# Patient Record
Sex: Male | Born: 1965 | Race: Black or African American | Hispanic: No | Marital: Married | Smoking: Former smoker
Health system: Southern US, Community
[De-identification: ages and names within clinical notes are randomized; demographics above are authoritative.]

## PROBLEM LIST (undated history)

## (undated) DIAGNOSIS — I10 Essential (primary) hypertension: Secondary | ICD-10-CM

## (undated) HISTORY — PX: CORONARY ARTERY BYPASS GRAFT: SHX141

---

## 1995-11-01 HISTORY — PX: COLON SURGERY: SHX602

## 1998-02-09 ENCOUNTER — Emergency Department (HOSPITAL_COMMUNITY): Admission: EM | Admit: 1998-02-09 | Discharge: 1998-02-09 | Payer: Self-pay | Admitting: Emergency Medicine

## 1998-04-06 ENCOUNTER — Emergency Department (HOSPITAL_COMMUNITY): Admission: EM | Admit: 1998-04-06 | Discharge: 1998-04-06 | Payer: Self-pay | Admitting: Emergency Medicine

## 2001-05-27 ENCOUNTER — Emergency Department (HOSPITAL_COMMUNITY): Admission: EM | Admit: 2001-05-27 | Discharge: 2001-05-27 | Payer: Self-pay | Admitting: Emergency Medicine

## 2004-01-05 ENCOUNTER — Emergency Department (HOSPITAL_COMMUNITY): Admission: EM | Admit: 2004-01-05 | Discharge: 2004-01-05 | Payer: Self-pay | Admitting: Emergency Medicine

## 2007-12-04 ENCOUNTER — Ambulatory Visit: Payer: Self-pay | Admitting: Internal Medicine

## 2007-12-04 DIAGNOSIS — K056 Periodontal disease, unspecified: Secondary | ICD-10-CM | POA: Insufficient documentation

## 2007-12-04 DIAGNOSIS — K069 Disorder of gingiva and edentulous alveolar ridge, unspecified: Secondary | ICD-10-CM

## 2007-12-04 DIAGNOSIS — H00019 Hordeolum externum unspecified eye, unspecified eyelid: Secondary | ICD-10-CM

## 2007-12-04 DIAGNOSIS — R079 Chest pain, unspecified: Secondary | ICD-10-CM

## 2007-12-07 ENCOUNTER — Encounter (INDEPENDENT_AMBULATORY_CARE_PROVIDER_SITE_OTHER): Payer: Self-pay | Admitting: Internal Medicine

## 2008-01-14 ENCOUNTER — Ambulatory Visit: Payer: Self-pay | Admitting: Internal Medicine

## 2008-01-16 ENCOUNTER — Ambulatory Visit: Payer: Self-pay | Admitting: Internal Medicine

## 2010-07-07 ENCOUNTER — Emergency Department (HOSPITAL_COMMUNITY): Admission: EM | Admit: 2010-07-07 | Discharge: 2010-07-07 | Payer: Self-pay | Admitting: Emergency Medicine

## 2010-11-28 LAB — CONVERTED CEMR LAB
ALT: 30 units/L (ref 0–53)
Alkaline Phosphatase: 68 units/L (ref 39–117)
Basophils Absolute: 0 10*3/uL (ref 0.0–0.1)
CO2: 22 meq/L (ref 19–32)
Cholesterol: 266 mg/dL — ABNORMAL HIGH (ref 0–200)
Creatinine, Ser: 1.15 mg/dL (ref 0.40–1.50)
Eosinophils Absolute: 0.3 10*3/uL (ref 0.0–0.7)
Eosinophils Relative: 4 % (ref 0–5)
Glucose, Urine, Semiquant: NEGATIVE
HCT: 45.3 % (ref 39.0–52.0)
Hemoglobin: 15.9 g/dL (ref 13.0–17.0)
LDL Cholesterol: 162 mg/dL — ABNORMAL HIGH (ref 0–99)
Lymphocytes Relative: 43 % (ref 12–46)
MCHC: 35.1 g/dL (ref 30.0–36.0)
MCV: 89.3 fL (ref 78.0–100.0)
Monocytes Absolute: 0.6 10*3/uL (ref 0.1–1.0)
Nitrite: NEGATIVE
Platelets: 199 10*3/uL (ref 150–400)
RDW: 13.3 % (ref 11.5–15.5)
Sodium: 143 meq/L (ref 135–145)
Total Bilirubin: 0.9 mg/dL (ref 0.3–1.2)
Total CHOL/HDL Ratio: 5.8
Total Protein: 7.9 g/dL (ref 6.0–8.3)
Triglycerides: 291 mg/dL — ABNORMAL HIGH (ref <150)
Urobilinogen, UA: 0.2
VLDL: 58 mg/dL — ABNORMAL HIGH (ref 0–40)
WBC Urine, dipstick: NEGATIVE

## 2017-03-02 ENCOUNTER — Encounter (HOSPITAL_COMMUNITY): Payer: Self-pay | Admitting: Emergency Medicine

## 2017-03-02 ENCOUNTER — Emergency Department (HOSPITAL_COMMUNITY)
Admission: EM | Admit: 2017-03-02 | Discharge: 2017-03-02 | Disposition: A | Discharge status: Home / Self Care | Payer: BLUE CROSS/BLUE SHIELD | Attending: Emergency Medicine | Admitting: Emergency Medicine

## 2017-03-02 DIAGNOSIS — H9201 Otalgia, right ear: Secondary | ICD-10-CM | POA: Diagnosis present

## 2017-03-02 DIAGNOSIS — H6691 Otitis media, unspecified, right ear: Secondary | ICD-10-CM | POA: Insufficient documentation

## 2017-03-02 DIAGNOSIS — H6690 Otitis media, unspecified, unspecified ear: Secondary | ICD-10-CM

## 2017-03-02 MED ORDER — FLUTICASONE PROPIONATE 50 MCG/ACT NA SUSP: 1.0000 | Freq: Every day | NASAL | 2 refills | Status: DC

## 2017-03-02 MED ORDER — AMOXICILLIN 500 MG PO CAPS: 500.0000 mg | ORAL_CAPSULE | Freq: Two times a day (BID) | ORAL | 0 refills | Status: DC

## 2017-03-02 NOTE — ED Provider Notes (Signed)
[facility]-EMER[facility]ENCY DEPT Provider Note   CSN: [id number] Arrival date & time: 03/02/17  0702     History   Chief Complaint Chief Complaint  Patient presents with  . Otalgia  . Hearing Problem    HPI Duane White is a 51 y.o. male.  HPI   51 year old male presents today with complaints of ear pain.  Patient notes over the last several weeks she has had coming and going ear pain.  Patient notes that this is localized to the right ear this morning.  He notes that his hearing has seemed muffled with the associated ear discomfort.  He also notes rhinorrhea, congestion.  Denies any productive cough, fever chills nausea vomiting.  No medications prior to arrival.  No history of allergies.    History reviewed. No pertinent past medical history.  Patient Active Problem List   Diagnosis Date Noted  . 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 CHEST PAIN 12/04/2007    History reviewed. No pertinent surgical history.     Home Medications    Prior to Admission medications   Medication Sig Start Date End Date Taking? Authorizing Provider  amoxicillin (AMOXIL) 500 MG capsule Take 1 capsule (500 mg total) by mouth 2 (two) times daily. 03/02/17   Eyvonne MechanicJeffrey Byran Bilotti, PA-C  fluticasone (FLONASE) 50 MCG/ACT nasal spray Place 1 spray into both nostrils daily. 03/02/17   Eyvonne MechanicJeffrey Aunna Snooks, PA-C    Family History No family history on file.  Social History Social History  Substance Use Topics  . Smoking status: Not on file  . Smokeless tobacco: Not on file  . Alcohol use Not on file     Allergies   Patient has no known allergies.   Review of Systems Review of Systems  All other systems reviewed and are negative.  Physical Exam Updated Vital Signs BP (!) 139/94   Pulse 66   Temp 98.2 F (36.8 C) (Oral)   Resp 18   SpO2 99%   Physical Exam  Constitutional: He is oriented to person, place, and time. He appears well-developed and well-nourished.  HENT:  Head:  Normocephalic and atraumatic.  Left sided TM redness  Eyes: Conjunctivae are normal. Pupils are equal, round, and reactive to light. Right eye exhibits no discharge. Left eye exhibits no discharge. No scleral icterus.  Neck: Normal range of motion. No JVD present. No tracheal deviation present.  Pulmonary/Chest: Effort normal. No stridor.  Neurological: He is alert and oriented to person, place, and time. Coordination normal.  Psychiatric: He has a normal mood and affect. His behavior is normal. Judgment and thought content normal.  Nursing note and vitals reviewed.   ED Treatments / Results  Labs (all labs ordered are listed, but only abnormal results are displayed) Labs Reviewed - No data to display  EKG  EKG Interpretation None       Radiology No results found.  Procedures Procedures (including critical care time)  Medications Ordered in ED Medications - No data to display   Initial Impression / Assessment and Plan / ED Course  I have reviewed the triage vital signs and the nursing notes.  Pertinent labs & imaging results that were available during my care of the patient were reviewed by me and considered in my medical decision making (see chart for details).     Final Clinical Impressions(s) / ED Diagnoses   Final diagnoses:  Acute otitis media, unspecified otitis media type    Discharge Meds: Amoxicillin  Assessment/Plan: 51 year old male presents today  with otitis media.  He is well-appearing in no acute distress.  He will be started on amoxicillin, encouraged use Tylenol ibuprofen.  Patient is given strict return precautions.  He verbalized understanding and agreement to today's plan had no further questions or concerns at the time discharge   New Prescriptions Discharge Medication List as of 03/02/2017  8:25 AM    START taking these medications   Details  amoxicillin (AMOXIL) 500 MG capsule Take 1 capsule (500 mg total) by mouth 2 (two) times daily.,  Starting Thu 03/02/2017, Print    fluticasone (FLONASE) 50 MCG/ACT nasal spray Place 1 spray into both nostrils daily., Starting Thu 03/02/2017, Print         Eyvonne Mechanic, PA-C 03/02/17 1215    Raeford Razor, MD 03/06/17 1217

## 2017-03-02 NOTE — Discharge Instructions (Signed)
Please read attached information. If you experience any new or worsening signs or symptoms please return to the emergency room for evaluation. Please follow-up with your primary care provider or specialist as discussed. Please use medication prescribed only as directed and discontinue taking if you have any concerning signs or symptoms.   °

## 2017-03-02 NOTE — ED Triage Notes (Signed)
Pt reports right ear pain and feeling like his hearing is coming and going in both ears x 2 weeks. Also reports recent cold symptoms as well.

## 2019-07-07 ENCOUNTER — Emergency Department
Admission: EM | Admit: 2019-07-07 | Discharge: 2019-07-07 | Disposition: A | Discharge status: Home / Self Care | Payer: BLUE CROSS/BLUE SHIELD | Attending: Emergency Medicine | Admitting: Emergency Medicine

## 2019-07-07 ENCOUNTER — Other Ambulatory Visit: Payer: Self-pay

## 2019-07-07 DIAGNOSIS — Y939 Activity, unspecified: Secondary | ICD-10-CM | POA: Insufficient documentation

## 2019-07-07 DIAGNOSIS — X58XXXA Exposure to other specified factors, initial encounter: Secondary | ICD-10-CM | POA: Insufficient documentation

## 2019-07-07 DIAGNOSIS — S0501XA Injury of conjunctiva and corneal abrasion without foreign body, right eye, initial encounter: Secondary | ICD-10-CM | POA: Insufficient documentation

## 2019-07-07 DIAGNOSIS — Y990 Civilian activity done for income or pay: Secondary | ICD-10-CM | POA: Insufficient documentation

## 2019-07-07 DIAGNOSIS — F17200 Nicotine dependence, unspecified, uncomplicated: Secondary | ICD-10-CM | POA: Insufficient documentation

## 2019-07-07 DIAGNOSIS — Y929 Unspecified place or not applicable: Secondary | ICD-10-CM | POA: Insufficient documentation

## 2019-07-07 MED ORDER — ERYTHROMYCIN 5 MG/GM OP OINT: 1.0000 "application " | TOPICAL_OINTMENT | Freq: Four times a day (QID) | OPHTHALMIC | 0 refills | Status: AC

## 2019-07-07 MED ORDER — ERYTHROMYCIN 5 MG/GM OP OINT
TOPICAL_OINTMENT | Freq: Once | OPHTHALMIC | Status: AC
  Administered 2019-07-07: 1 via OPHTHALMIC
  Filled 2019-07-07: qty 1

## 2019-07-07 MED ORDER — KETOROLAC TROMETHAMINE 0.5 % OP SOLN: 1.0000 [drp] | Freq: Four times a day (QID) | OPHTHALMIC | 0 refills | Status: DC

## 2019-07-07 MED ORDER — FLUORESCEIN SODIUM 1 MG OP STRP
1.0000 | ORAL_STRIP | Freq: Once | OPHTHALMIC | Status: AC
  Administered 2019-07-07: 1 via OPHTHALMIC
  Filled 2019-07-07: qty 1

## 2019-07-07 MED ORDER — TETRACAINE HCL 0.5 % OP SOLN
2.0000 [drp] | Freq: Once | OPHTHALMIC | Status: AC
  Administered 2019-07-07: 08:00:00 2 [drp] via OPHTHALMIC
  Filled 2019-07-07: qty 4

## 2019-07-07 NOTE — ED Provider Notes (Signed)
[facility] Emergency Department Provider Note ____________________________________________  Time seen: Approximately 8:28 AM  I have reviewed the triage vital signs and the nursing notes.   HISTORY  Chief Complaint Foreign Body in Eye   HPI Duane White is a 53 y.o. male who presents to the emergency department for evaluation of right eye pain. He states that something got in his eye a few days ago when he was at work. Then last night, something flew into his eye. Pain and irritation since. No alleviating measures prior to arrival.   History reviewed. No pertinent past medical history.  Patient Active Problem List   Diagnosis Date Noted  . 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 CHEST PAIN 12/04/2007    History reviewed. No pertinent surgical history.  Prior to Admission medications   Medication Sig Start Date End Date Taking? Authorizing Provider  erythromycin ophthalmic ointment Place 1 application into the right eye 4 (four) times daily for 5 days. 07/07/19 07/12/19  Nashira Mcglynn, Kasandra Knudsenari B, FNP  ketorolac (ACULAR) 0.5 % ophthalmic solution Place 1 drop into the right eye 4 (four) times daily. 07/07/19   Chinita Pesterriplett, Shannell Mikkelsen B, FNP    Allergies Patient has no known allergies.  No family history on file.  Social History Social History   Tobacco Use  . Smoking status: Current Every Day Smoker  . Smokeless tobacco: Never Used  Substance Use Topics  . Alcohol use: Yes  . Drug use: Not Currently    Review of Systems   Constitutional: No fever/chills Eyes: Negative for visual changes. positive for pain. negative for drainage. Musculoskeletal: Negative for pain. Skin: Negative for rash. Neurological: Negative for headaches, focal weakness or numbness. Allergic: Negative for seasonal allergies. ____________________________________________  PHYSICAL EXAM:  VITAL SIGNS: ED Triage Vitals  Enc Vitals Group     BP 07/07/19 0744 (!) 141/84      Pulse Rate 07/07/19 0744 62     Resp 07/07/19 0744 18     Temp 07/07/19 0744 98.8 F (37.1 C)     Temp Source 07/07/19 0744 Oral     SpO2 07/07/19 0744 98 %     Weight 07/07/19 0742 220 lb (99.8 kg)     Height 07/07/19 0742 5\' 8"  (1.727 m)     Head Circumference --      Peak Flow --      Pain Score 07/07/19 0741 3     Pain Loc --      Pain Edu? --      Excl. in GC? --     Constitutional: Alert and oriented. Well appearing and in no acute distress. Eyes: Visual acuity--see nursing documentation; no globe trauma; Eyelids normal to inspection; Sclera appears anicteric.  Eyelid was inverted. Conjunctiva appears erythematous; Cornea with (2) 1-602mm abrasions at 9 o'clock and 3 o'clock. Head: Atraumatic. Nose: No congestion/rhinnorhea. Mouth/Throat: Mucous membranes are moist.  Oropharynx non-erythematous. Respiratory: Respirations even and unlabored. Breath sounds clear to auscultation. Musculoskeletal:Normal ROM x 4 extremities. Neurologic:  Normal speech and language. No gross focal neurologic deficits are appreciated. Speech is normal. No gait instability. Skin:  Skin is warm, dry and intact. No rash noted. Psychiatric: Mood and affect are normal. Speech and behavior are normal.  ____________________________________________   LABS (all labs ordered are listed, but only abnormal results are displayed)  Labs Reviewed - No data to display ____________________________________________  EKG  Not indicated ____________________________________________  RADIOLOGY  Not indicated ____________________________________________   PROCEDURES  Procedure(s) performed: None  ____________________________________________   INITIAL IMPRESSION / ASSESSMENT AND PLAN / ED COURSE  53 year old male presenting for eye pain and irritation. Corneal abrasions noted on fluorescein exam. He will be treated with erythromycin ointment and Acular. He was advised to follow up with ophthalmology if  not improving over the next 2-3 days. He was advised to return to the ER if unable to schedule and appointment.  Pertinent labs & imaging results that were available during my care of the patient were reviewed by me and considered in my medical decision making (see chart for details). ____________________________________________   FINAL CLINICAL IMPRESSION(S) / ED DIAGNOSES  Final diagnoses:  Abrasion of right cornea, initial encounter    Note:  This document was prepared using Dragon voice recognition software and may include unintentional dictation errors.    Victorino Dike, FNP 07/07/19 6010    Delman Kitten, MD 07/07/19 [phone]

## 2019-07-07 NOTE — ED Notes (Signed)
See triage note  States he developed right eye pain couple of days ago  But felt something in eye last pm   Eye isred and irritated

## 2019-07-07 NOTE — ED Triage Notes (Signed)
Pt states something got is his right eye yesterday and has been irritated ever since. Pt is in NAD> eye is red and appears irritated

## 2020-03-03 ENCOUNTER — Other Ambulatory Visit: Payer: Self-pay

## 2020-03-03 ENCOUNTER — Ambulatory Visit (HOSPITAL_COMMUNITY)
Admission: EM | Admit: 2020-03-03 | Discharge: 2020-03-03 | Disposition: A | Discharge status: Home / Self Care | Payer: Self-pay

## 2020-03-03 ENCOUNTER — Emergency Department (HOSPITAL_COMMUNITY)
Admission: EM | Admit: 2020-03-03 | Discharge: 2020-03-03 | Discharge status: Against Medical Advice | Payer: Self-pay | Attending: Emergency Medicine | Admitting: Emergency Medicine

## 2020-03-03 ENCOUNTER — Emergency Department (HOSPITAL_COMMUNITY): Payer: Self-pay

## 2020-03-03 ENCOUNTER — Encounter (HOSPITAL_COMMUNITY): Payer: Self-pay | Admitting: Emergency Medicine

## 2020-03-03 DIAGNOSIS — F17210 Nicotine dependence, cigarettes, uncomplicated: Secondary | ICD-10-CM | POA: Insufficient documentation

## 2020-03-03 DIAGNOSIS — R1013 Epigastric pain: Secondary | ICD-10-CM

## 2020-03-03 LAB — CBC
HCT: 45 % (ref 39.0–52.0)
Hemoglobin: 15.2 g/dL (ref 13.0–17.0)
MCH: 32.4 pg (ref 26.0–34.0)
MCHC: 33.8 g/dL (ref 30.0–36.0)
MCV: 95.9 fL (ref 80.0–100.0)
Platelets: 169 10*3/uL (ref 150–400)
RBC: 4.69 MIL/uL (ref 4.22–5.81)
RDW: 12.7 % (ref 11.5–15.5)
WBC: 6.2 10*3/uL (ref 4.0–10.5)
nRBC: 0 % (ref 0.0–0.2)

## 2020-03-03 LAB — COMPREHENSIVE METABOLIC PANEL
ALT: 35 U/L (ref 0–44)
AST: 38 U/L (ref 15–41)
Albumin: 4.1 g/dL (ref 3.5–5.0)
Alkaline Phosphatase: 69 U/L (ref 38–126)
Anion gap: 10 (ref 5–15)
BUN: 9 mg/dL (ref 6–20)
CO2: 23 mmol/L (ref 22–32)
Calcium: 9.4 mg/dL (ref 8.9–10.3)
Chloride: 106 mmol/L (ref 98–111)
Creatinine, Ser: 1.12 mg/dL (ref 0.61–1.24)
GFR calc Af Amer: >60 mL/min (ref >60)
GFR calc non Af Amer: >60 mL/min (ref >60)
Glucose, Bld: 139 mg/dL — ABNORMAL HIGH (ref 70–99)
Potassium: 3.3 mmol/L — ABNORMAL LOW (ref 3.5–5.1)
Sodium: 139 mmol/L (ref 135–145)
Total Bilirubin: 1.8 mg/dL — ABNORMAL HIGH (ref 0.3–1.2)
Total Protein: 7.1 g/dL (ref 6.5–8.1)

## 2020-03-03 LAB — LIPASE, BLOOD: Lipase: 20 U/L (ref 11–51)

## 2020-03-03 LAB — URINALYSIS, ROUTINE W REFLEX MICROSCOPIC
Bilirubin Urine: NEGATIVE
Glucose, UA: NEGATIVE mg/dL
Ketones, ur: NEGATIVE mg/dL
Leukocytes,Ua: NEGATIVE
Nitrite: NEGATIVE
Protein, ur: 30 mg/dL — AB
Specific Gravity, Urine: 1.027 (ref 1.005–1.030)
pH: 5 (ref 5.0–8.0)

## 2020-03-03 MED ORDER — LIDOCAINE VISCOUS HCL 2 % MT SOLN
15.0000 mL | Freq: Once | OROMUCOSAL | Status: AC
  Administered 2020-03-03: 16:00:00 15 mL via ORAL
  Filled 2020-03-03: qty 15

## 2020-03-03 MED ORDER — ALUM & MAG HYDROXIDE-SIMETH 200-200-20 MG/5ML PO SUSP
30.0000 mL | Freq: Once | ORAL | Status: AC
  Administered 2020-03-03: 16:00:00 30 mL via ORAL
  Filled 2020-03-03 (×2): qty 30

## 2020-03-03 MED ORDER — SODIUM CHLORIDE 0.9% FLUSH: 3.0000 mL | Freq: Once | INTRAVENOUS | Status: DC

## 2020-03-03 MED ORDER — PANTOPRAZOLE SODIUM 20 MG PO TBEC: 20.0000 mg | DELAYED_RELEASE_TABLET | Freq: Every day | ORAL | 1 refills | Status: AC

## 2020-03-03 NOTE — Discharge Instructions (Signed)
Prior discussion, I am prescribing you Protonix.  This is an antacid medication that you can take once daily in the morning for relief of your upper abdominal pain.  I would highly recommend that you stop drinking alcohol.  It is likely worsening your symptoms.  Because you decided to leave AMA today, I would [facility] and your symptoms.  Please do not hesitate to return to the emergency department any new or worsening symptoms.

## 2020-03-03 NOTE — ED Provider Notes (Signed)
Duane White   CSN: [id number] Arrival date & time: 03/03/20  1318     History Chief Complaint  Patient presents with  . Abdominal Pain    Duane White is a 54 y.o. male.  HPI HPI Comments: Duane White is a 54 y.o. male with history of chronic alcohol abuse who presents to the Emergency Department complaining of abdominal pain.  He states he began experiencing 10 out of 10 epigastric pain which has been gradually alleviating for the past 2 days.  This morning he bent over and felt like it worsened and is now currently a 2/10.  He was being evaluated at the [facility] and they recommended he come to the emergency department for further evaluation.  He states that he still consumes alcohol and drinks about 6 drinks per day.  Prior to his symptoms worsening he drank about 8-10 drinks.  He reports nausea last night but none now.  He notes having a significant medical history in regards to his abdomen but cannot specify what this is.  He states he has been admitted for "his stomach".  He denies vomiting, diarrhea, fevers, chills, constipation, URI symptoms, chest pain, shortness of breath, urinary changes, syncope.       History reviewed. No pertinent past medical history.  Patient Active Problem List   Diagnosis Date Noted  . 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 CHEST PAIN 12/04/2007    History reviewed. No pertinent surgical history.    No family history on file.  Social History   Tobacco Use  . Smoking status: Current Every Day Smoker  . Smokeless tobacco: Never Used  Substance Use Topics  . Alcohol use: Yes    Comment: 4 liquor drinks/day average  . Drug use: Not Currently    Home Medications Prior to Admission medications   Medication Sig Start Date End Date Taking? Authorizing Provider  bismuth subsalicylate (PEPTO BISMOL) 262 MG/15ML suspension Take 30 mLs by mouth every 6 (six) hours as needed for  indigestion.   Yes [provider]  ketorolac (ACULAR) 0.5 % ophthalmic solution Place 1 drop into the right eye 4 (four) times daily. Patient not taking: Reported on 03/03/2020 07/07/19   Victorino Dike, FNP    Allergies    Patient has no known allergies.  Review of Systems   Review of Systems  All other systems reviewed and are negative. Ten systems reviewed and are negative for acute change, except as noted in the HPI.   Physical Exam Updated Vital Signs BP (!) 150/96   Pulse 79   Temp 98.6 F (37 C) (Oral)   Resp 16   Ht '5\' 9"'  (1.753 m)   Wt 112 kg   SpO2 98%   BMI 36.48 kg/m   Physical Exam Vitals and nursing White reviewed.  Constitutional:      General: He is not in acute distress.    Appearance: Normal appearance. He is obese. He is not ill-appearing, toxic-appearing or diaphoretic.  HENT:     Head: Normocephalic and atraumatic.     Right Ear: External ear normal.     Left Ear: External ear normal.     Nose: Nose normal.     Mouth/Throat:     Mouth: Mucous membranes are moist.     Pharynx: Oropharynx is clear. No oropharyngeal exudate or posterior oropharyngeal erythema.  Eyes:     Extraocular Movements: Extraocular movements intact.  Cardiovascular:  Rate and Rhythm: Normal rate and regular rhythm.     Pulses: Normal pulses.     Heart sounds: Normal heart sounds. No murmur. No friction rub. No gallop.   Pulmonary:     Effort: Pulmonary effort is normal. No respiratory distress.     Breath sounds: Normal breath sounds. No stridor. No wheezing, rhonchi or rales.  Abdominal:     General: Abdomen is protuberant. A surgical scar is present. Bowel sounds are normal.     Palpations: Abdomen is soft.     Tenderness: There is abdominal tenderness in the right upper quadrant, epigastric area and suprapubic area. There is no right CVA tenderness, left CVA tenderness, guarding or rebound.  Musculoskeletal:        General: Normal range of motion.     Cervical  back: Normal range of motion and neck supple. No tenderness.  Skin:    General: Skin is warm and dry.  Neurological:     General: No focal deficit present.     Mental Status: He is alert and oriented to person, place, and time.  Psychiatric:        Mood and Affect: Mood normal.        Behavior: Behavior normal.     ED Results / Procedures / Treatments   Labs (all labs ordered are listed, but only abnormal results are displayed) Labs Reviewed  COMPREHENSIVE METABOLIC PANEL - Abnormal; Notable for the following components:      Result Value   Potassium 3.3 (*)    Glucose, Bld 139 (*)    Total Bilirubin 1.8 (*)    All other components within normal limits  URINALYSIS, ROUTINE W REFLEX MICROSCOPIC - Abnormal; Notable for the following components:   Color, Urine AMBER (*)    APPearance HAZY (*)    Hgb urine dipstick SMALL (*)    Protein, ur 30 (*)    Bacteria, UA RARE (*)    All other components within normal limits  LIPASE, BLOOD  CBC   EKG None  Radiology DG Abdomen Acute W/Chest  Result Date: 03/03/2020 CLINICAL DATA:  Epigastric abdominal pain. EXAM: DG ABDOMEN ACUTE W/ 1V CHEST COMPARISON:  None FINDINGS: The upright chest x-ray is unremarkable. The cardiac silhouette, mediastinal and hilar contours are within normal limits. The lungs are clear. No pleural effusions or pulmonary lesions. Two views of the abdomen demonstrate an unremarkable bowel gas pattern. There is some scattered air in the colon and down into the rectum and a few scattered loops of small bowel with air but no distension or air-fluid levels. No free air. The soft tissue shadows of the abdomen are maintained. No worrisome calcifications. The bony structures are intact. IMPRESSION: 1. No acute cardiopulmonary findings. 2. No plain film findings for an acute abdominal process. Electronically Signed   By: Marijo Sanes M.D.   On: 03/03/2020 16:01    Procedures Procedures (including critical care  time)  Medications Ordered in ED Medications  sodium chloride flush (NS) 0.9 % injection 3 mL (has no administration in time range)    ED Course  I have reviewed the triage vital signs and the nursing notes.  Pertinent labs & imaging results that were available during my care of the patient were reviewed by me and considered in my medical decision making (see chart for details).    MDM Rules/Calculators/A&P                      3:36 PM  patient is a 54 year old male that presents with waxing and waning abdominal pain.  Physical exam is significant for suprapubic pain, epigastric pain, right upper quadrant pain.  His pain seems to be worst in the epigastric region.  He is not on any regular medications.  He is an alcoholic and typically drinks about 6 drinks per day but states that he probably drank close to 10 prior to his symptoms occurring.  Initial labs show a potassium of 3.3.  Lipase negative.  LFTs normal.  No elevation in alk phos.  UA shows small hemoglobin, proteinuria, rare bacteria.  Will obtain abdominal x-rays.  Will give GI cocktail. Will reassess.  5:08 PM patient states he is feeling significantly better after the GI cocktail.  X-ray of the chest and abdomen are negative.  Labs show potassium of 3.3.  I discussed this with the patient and this is likely being secondary to his diet as well as alcohol use.  We discussed his alcohol use in length.  I recommended a CT scan of his abdomen due to his inability to provide detail regarding his medical history as well as what seems to be a significant GI medical history.  He declined this and decided to leave AMA.  Myself and the nursing staff discussed the risks of this decision.  He verbalized understanding of this and states that he will follow-up with the [facility] as soon as possible regarding this visit and his symptoms.  I prescribed him Protonix for his symptoms.  He was instructed to return to the emergency department if he develops new or  worsening symptoms.    Final Clinical Impression(s) / ED Diagnoses Final diagnoses:  Epigastric pain    Rx / DC Orders ED Discharge Orders         Ordered    pantoprazole (PROTONIX) 20 MG tablet  Daily     03/03/20 1705           Rayna Sexton, PA-C 03/03/20 1719    Varney Biles, MD 03/05/20 1911

## 2020-03-03 NOTE — ED Notes (Signed)
Patient verbalizes understanding of discharge instructions. Opportunity for questioning and answers were provided. Armband removed by staff, pt discharged from ED ambulatory. Pt left AMA, refused CT scan.

## 2020-03-03 NOTE — ED Triage Notes (Addendum)
Pt reports he has been having lower abd pain, [facility]. Pt went to UC initially and was sent here. Denies n/v/d, constipation, urinary symptoms. Drinks liquor daily.

## 2020-03-03 NOTE — ED Triage Notes (Signed)
Patient presented to UC. Sent from the [place]. States that the providers that the [place] told him he needs a CT scan. Informed patient we do not do that here, and he would need to go to the ED. Patient agreeable.

## 2020-03-19 ENCOUNTER — Encounter (HOSPITAL_COMMUNITY): Payer: Self-pay | Admitting: Emergency Medicine

## 2020-03-19 ENCOUNTER — Other Ambulatory Visit: Payer: Self-pay

## 2020-03-19 ENCOUNTER — Inpatient Hospital Stay (HOSPITAL_COMMUNITY)
Admission: EM | Admit: 2020-03-19 | Discharge: 2020-03-22 | DRG: 392 | Disposition: A | Discharge status: Home / Self Care | Payer: No Typology Code available for payment source | Attending: Internal Medicine | Admitting: Internal Medicine

## 2020-03-19 ENCOUNTER — Emergency Department (HOSPITAL_COMMUNITY): Payer: No Typology Code available for payment source

## 2020-03-19 DIAGNOSIS — F1010 Alcohol abuse, uncomplicated: Secondary | ICD-10-CM | POA: Diagnosis present

## 2020-03-19 DIAGNOSIS — K219 Gastro-esophageal reflux disease without esophagitis: Secondary | ICD-10-CM | POA: Diagnosis present

## 2020-03-19 DIAGNOSIS — K5720 Diverticulitis of large intestine with perforation and abscess without bleeding: Principal | ICD-10-CM

## 2020-03-19 DIAGNOSIS — K5900 Constipation, unspecified: Secondary | ICD-10-CM | POA: Diagnosis present

## 2020-03-19 DIAGNOSIS — Z933 Colostomy status: Secondary | ICD-10-CM

## 2020-03-19 DIAGNOSIS — Z20822 Contact with and (suspected) exposure to covid-19: Secondary | ICD-10-CM | POA: Diagnosis present

## 2020-03-19 DIAGNOSIS — K5792 Diverticulitis of intestine, part unspecified, without perforation or abscess without bleeding: Secondary | ICD-10-CM | POA: Diagnosis present

## 2020-03-19 DIAGNOSIS — F17200 Nicotine dependence, unspecified, uncomplicated: Secondary | ICD-10-CM | POA: Diagnosis present

## 2020-03-19 LAB — COMPREHENSIVE METABOLIC PANEL
ALT: 33 U/L (ref 0–44)
AST: 27 U/L (ref 15–41)
Albumin: 4 g/dL (ref 3.5–5.0)
Alkaline Phosphatase: 61 U/L (ref 38–126)
Anion gap: 13 (ref 5–15)
BUN: 8 mg/dL (ref 6–20)
CO2: 21 mmol/L — ABNORMAL LOW (ref 22–32)
Calcium: 9.2 mg/dL (ref 8.9–10.3)
Chloride: 104 mmol/L (ref 98–111)
Creatinine, Ser: 1.06 mg/dL (ref 0.61–1.24)
GFR calc Af Amer: >60 mL/min (ref >60)
GFR calc non Af Amer: >60 mL/min (ref >60)
Glucose, Bld: 111 mg/dL — ABNORMAL HIGH (ref 70–99)
Potassium: 3.4 mmol/L — ABNORMAL LOW (ref 3.5–5.1)
Sodium: 138 mmol/L (ref 135–145)
Total Bilirubin: 1.9 mg/dL — ABNORMAL HIGH (ref 0.3–1.2)
Total Protein: 7.4 g/dL (ref 6.5–8.1)

## 2020-03-19 LAB — CBC
HCT: 43 % (ref 39.0–52.0)
Hemoglobin: 14.7 g/dL (ref 13.0–17.0)
MCH: 32.2 pg (ref 26.0–34.0)
MCHC: 34.2 g/dL (ref 30.0–36.0)
MCV: 94.3 fL (ref 80.0–100.0)
Platelets: 200 10*3/uL (ref 150–400)
RBC: 4.56 MIL/uL (ref 4.22–5.81)
RDW: 12.3 % (ref 11.5–15.5)
WBC: 9.5 10*3/uL (ref 4.0–10.5)
nRBC: 0 % (ref 0.0–0.2)

## 2020-03-19 LAB — URINALYSIS, ROUTINE W REFLEX MICROSCOPIC
Bacteria, UA: NONE SEEN
Bilirubin Urine: NEGATIVE
Glucose, UA: NEGATIVE mg/dL
Ketones, ur: NEGATIVE mg/dL
Leukocytes,Ua: NEGATIVE
Nitrite: NEGATIVE
Protein, ur: 30 mg/dL — AB
Specific Gravity, Urine: >1.046 — ABNORMAL HIGH (ref 1.005–1.030)
pH: 5 (ref 5.0–8.0)

## 2020-03-19 LAB — HIV ANTIBODY (ROUTINE TESTING W REFLEX): HIV Screen 4th Generation wRfx: NONREACTIVE

## 2020-03-19 LAB — SARS CORONAVIRUS 2 BY RT PCR (HOSPITAL ORDER, PERFORMED IN ~~LOC~~ HOSPITAL LAB): SARS Coronavirus 2: NEGATIVE

## 2020-03-19 LAB — LIPASE, BLOOD: Lipase: 21 U/L (ref 11–51)

## 2020-03-19 MED ORDER — POLYETHYLENE GLYCOL 3350 17 G PO PACK
17.0000 g | PACK | Freq: Every day | ORAL | Status: DC
  Administered 2020-03-19: 17 g via ORAL
  Filled 2020-03-19 (×2): qty 1

## 2020-03-19 MED ORDER — THIAMINE HCL 100 MG PO TABS
100.0000 mg | ORAL_TABLET | Freq: Every day | ORAL | Status: DC
  Administered 2020-03-19 – 2020-03-22 (×4): 100 mg via ORAL
  Filled 2020-03-19 (×4): qty 1

## 2020-03-19 MED ORDER — HYDROMORPHONE HCL 1 MG/ML IJ SOLN: 0.5000 mg | INTRAMUSCULAR | Status: DC | PRN

## 2020-03-19 MED ORDER — ONDANSETRON HCL 4 MG/2ML IJ SOLN: 4.0000 mg | Freq: Four times a day (QID) | INTRAMUSCULAR | Status: DC | PRN

## 2020-03-19 MED ORDER — OXYCODONE HCL 5 MG PO TABS
5.0000 mg | ORAL_TABLET | ORAL | Status: DC | PRN
  Administered 2020-03-19: 5 mg via ORAL
  Filled 2020-03-19: qty 1

## 2020-03-19 MED ORDER — ONDANSETRON HCL 4 MG PO TABS: 4.0000 mg | ORAL_TABLET | Freq: Four times a day (QID) | ORAL | Status: DC | PRN

## 2020-03-19 MED ORDER — ENOXAPARIN SODIUM 40 MG/0.4ML ~~LOC~~ SOLN
40.0000 mg | SUBCUTANEOUS | Status: DC
  Filled 2020-03-19: qty 0.4

## 2020-03-19 MED ORDER — PIPERACILLIN-TAZOBACTAM 3.375 G IVPB
3.3750 g | Freq: Three times a day (TID) | INTRAVENOUS | Status: DC
  Administered 2020-03-19 – 2020-03-21 (×5): 3.375 g via INTRAVENOUS
  Filled 2020-03-19 (×6): qty 50

## 2020-03-19 MED ORDER — PANTOPRAZOLE SODIUM 20 MG PO TBEC
20.0000 mg | DELAYED_RELEASE_TABLET | Freq: Every day | ORAL | Status: DC
  Filled 2020-03-19: qty 1

## 2020-03-19 MED ORDER — ACETAMINOPHEN 650 MG RE SUPP: 650.0000 mg | Freq: Four times a day (QID) | RECTAL | Status: DC | PRN

## 2020-03-19 MED ORDER — SODIUM CHLORIDE 0.9% FLUSH
3.0000 mL | Freq: Once | INTRAVENOUS | Status: AC
  Administered 2020-03-19: 3 mL via INTRAVENOUS

## 2020-03-19 MED ORDER — IOHEXOL 300 MG/ML  SOLN
100.0000 mL | Freq: Once | INTRAMUSCULAR | Status: AC | PRN
  Administered 2020-03-19: 100 mL via INTRAVENOUS

## 2020-03-19 MED ORDER — LACTATED RINGERS IV SOLN: INTRAVENOUS | Status: DC

## 2020-03-19 MED ORDER — ACETAMINOPHEN 325 MG PO TABS
650.0000 mg | ORAL_TABLET | Freq: Four times a day (QID) | ORAL | Status: DC | PRN
  Administered 2020-03-19: 650 mg via ORAL
  Filled 2020-03-19: qty 2

## 2020-03-19 MED ORDER — PIPERACILLIN-TAZOBACTAM 3.375 G IVPB 30 MIN
3.3750 g | Freq: Once | INTRAVENOUS | Status: AC
  Administered 2020-03-19: 3.375 g via INTRAVENOUS
  Filled 2020-03-19: qty 50

## 2020-03-19 MED ORDER — FOLIC ACID 1 MG PO TABS
1.0000 mg | ORAL_TABLET | Freq: Every day | ORAL | Status: DC
  Administered 2020-03-19 – 2020-03-22 (×4): 1 mg via ORAL
  Filled 2020-03-19 (×4): qty 1

## 2020-03-19 NOTE — Progress Notes (Signed)
Pharmacy Antibiotic Note  Duane White is a 54 y.o. male admitted on 03/19/2020 with diverticulitis, possible perforation.  Pharmacy has been consulted for zosyn dosing.  Plan: Zosyn 3.375g IV every 8 hours (extended infusion) Monitor renal function, surg plans, LOT  Height: 5\' 9"  (175.3 cm) Weight: 111.1 kg (245 lb) IBW/kg (Calculated) : 70.7  Temp (24hrs), Avg:98.6 F (37 C), Min:98.6 F (37 C), Max:98.6 F (37 C)  Recent Labs  Lab 03/19/20 0558  WBC 9.5  CREATININE 1.06    Estimated Creatinine Clearance: 97.9 mL/min (by C-G formula based on SCr of 1.06 mg/dL).    No Known Allergies  03/21/20, PharmD Clinical Pharmacist ED Pharmacist Phone # [phone] 03/19/2020 11:14 AM
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
## 2020-03-19 NOTE — ED Notes (Signed)
Report attempted 
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
## 2020-03-19 NOTE — ED Triage Notes (Signed)
Pt in with R upper and lower abdominal pain x 2 wks. Came to ED then for eval, but never stayed for the CT. Has been taking Protonix since. States he vomited x 2 PTA. Pain is sharp and intermittent

## 2020-03-19 NOTE — ED Notes (Signed)
Pt returned to room from CT

## 2020-03-20 DIAGNOSIS — Z933 Colostomy status: Secondary | ICD-10-CM | POA: Diagnosis not present

## 2020-03-20 DIAGNOSIS — K5720 Diverticulitis of large intestine with perforation and abscess without bleeding: Principal | ICD-10-CM

## 2020-03-20 DIAGNOSIS — F17200 Nicotine dependence, unspecified, uncomplicated: Secondary | ICD-10-CM | POA: Diagnosis present

## 2020-03-20 DIAGNOSIS — Z20822 Contact with and (suspected) exposure to covid-19: Secondary | ICD-10-CM | POA: Diagnosis present

## 2020-03-20 DIAGNOSIS — K5900 Constipation, unspecified: Secondary | ICD-10-CM | POA: Diagnosis present

## 2020-03-20 DIAGNOSIS — K219 Gastro-esophageal reflux disease without esophagitis: Secondary | ICD-10-CM | POA: Diagnosis present

## 2020-03-20 DIAGNOSIS — F1010 Alcohol abuse, uncomplicated: Secondary | ICD-10-CM | POA: Diagnosis present

## 2020-03-20 DIAGNOSIS — K5792 Diverticulitis of intestine, part unspecified, without perforation or abscess without bleeding: Secondary | ICD-10-CM | POA: Diagnosis present

## 2020-03-20 LAB — CBC
HCT: 41.2 % (ref 39.0–52.0)
Hemoglobin: 14 g/dL (ref 13.0–17.0)
MCH: 31.9 pg (ref 26.0–34.0)
MCHC: 34 g/dL (ref 30.0–36.0)
MCV: 93.8 fL (ref 80.0–100.0)
Platelets: 185 10*3/uL (ref 150–400)
RBC: 4.39 MIL/uL (ref 4.22–5.81)
RDW: 12.1 % (ref 11.5–15.5)
WBC: 11.8 10*3/uL — ABNORMAL HIGH (ref 4.0–10.5)
nRBC: 0 % (ref 0.0–0.2)

## 2020-03-20 LAB — BASIC METABOLIC PANEL
Anion gap: 9 (ref 5–15)
BUN: 9 mg/dL (ref 6–20)
CO2: 24 mmol/L (ref 22–32)
Calcium: 9 mg/dL (ref 8.9–10.3)
Chloride: 104 mmol/L (ref 98–111)
Creatinine, Ser: 1.13 mg/dL (ref 0.61–1.24)
GFR calc Af Amer: >60 mL/min (ref >60)
GFR calc non Af Amer: >60 mL/min (ref >60)
Glucose, Bld: 92 mg/dL (ref 70–99)
Potassium: 3.6 mmol/L (ref 3.5–5.1)
Sodium: 137 mmol/L (ref 135–145)

## 2020-03-20 MED ORDER — POLYETHYLENE GLYCOL 3350 17 G PO PACK: 17.0000 g | PACK | Freq: Every day | ORAL | Status: DC | PRN
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
## 2020-03-21 DIAGNOSIS — K5720 Diverticulitis of large intestine with perforation and abscess without bleeding: Secondary | ICD-10-CM

## 2020-03-21 LAB — CBC
HCT: 44.5 % (ref 39.0–52.0)
Hemoglobin: 15.1 g/dL (ref 13.0–17.0)
MCH: 31.9 pg (ref 26.0–34.0)
MCHC: 33.9 g/dL (ref 30.0–36.0)
MCV: 93.9 fL (ref 80.0–100.0)
Platelets: 210 10*3/uL (ref 150–400)
RBC: 4.74 MIL/uL (ref 4.22–5.81)
RDW: 12 % (ref 11.5–15.5)
WBC: 6.9 10*3/uL (ref 4.0–10.5)
nRBC: 0 % (ref 0.0–0.2)

## 2020-03-21 LAB — BASIC METABOLIC PANEL
Anion gap: 9 (ref 5–15)
BUN: 10 mg/dL (ref 6–20)
CO2: 26 mmol/L (ref 22–32)
Calcium: 9.4 mg/dL (ref 8.9–10.3)
Chloride: 103 mmol/L (ref 98–111)
Creatinine, Ser: 1.1 mg/dL (ref 0.61–1.24)
GFR calc Af Amer: >60 mL/min (ref >60)
GFR calc non Af Amer: >60 mL/min (ref >60)
Glucose, Bld: 92 mg/dL (ref 70–99)
Potassium: 3.8 mmol/L (ref 3.5–5.1)
Sodium: 138 mmol/L (ref 135–145)

## 2020-03-21 MED ORDER — AMOXICILLIN-POT CLAVULANATE 875-125 MG PO TABS
1.0000 | ORAL_TABLET | Freq: Three times a day (TID) | ORAL | Status: DC
  Administered 2020-03-21 – 2020-03-22 (×4): 1 via ORAL
  Filled 2020-03-21 (×5): qty 1
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
## 2020-03-21 NOTE — Progress Notes (Signed)
Assumed care from [facility], LPN.will continue to monitor.

## 2020-03-22 LAB — CBC
HCT: 42.4 % (ref 39.0–52.0)
Hemoglobin: 14.8 g/dL (ref 13.0–17.0)
MCH: 32.5 pg (ref 26.0–34.0)
MCHC: 34.9 g/dL (ref 30.0–36.0)
MCV: 93 fL (ref 80.0–100.0)
Platelets: 221 10*3/uL (ref 150–400)
RBC: 4.56 MIL/uL (ref 4.22–5.81)
RDW: 11.9 % (ref 11.5–15.5)
WBC: 6.6 10*3/uL (ref 4.0–10.5)
nRBC: 0 % (ref 0.0–0.2)

## 2020-03-22 MED ORDER — AMOXICILLIN-POT CLAVULANATE 875-125 MG PO TABS: 1.0000 | ORAL_TABLET | Freq: Three times a day (TID) | ORAL | 0 refills | Status: AC

## 2020-03-22 MED ORDER — POLYETHYLENE GLYCOL 3350 17 G PO PACK: 17.0000 g | PACK | Freq: Every day | ORAL | 0 refills | Status: AC | PRN

## 2020-03-22 NOTE — Discharge Instructions (Signed)
Mr. Duane White,  It was a pleasure working with you during your stay at [facility]. You were diagnosed with diverticulitis during your stay. You were treated with antibiotics. You will be discharged on antibiotics. Please take them as indicated, 3 times a day and 8 hours apart. Please take your first dose at 6:30 pm today. Please follow up with your primary care providers and have a follow up colonoscopy in 6 weeks time. If you begin to experience extreme lower abdominal pain, high fevers, or change in mental status please go to the nearest emergency department for evaluation.     Diverticulitis  Diverticulitis is when small pockets in your large intestine (colon) get infected or swollen. This causes stomach pain and watery poop (diarrhea). These pouches are called diverticula. They form in people who have a condition called diverticulosis. Follow these instructions at home: Medicines  Take over-the-counter and prescription medicines only as told by your doctor. These include: ? Antibiotics. ? Pain medicines. ? Fiber pills. ? Probiotics. ? Stool softeners.  Do not drive or use heavy machinery while taking prescription pain medicine.  If you were prescribed an antibiotic, take it as told. Do not stop taking it even if you feel better. General instructions   Follow a diet as told by your doctor.  When you feel better, your doctor may tell you to change your diet. You may need to eat a lot of fiber. Fiber makes it easier to poop (have bowel movements). Healthy foods with fiber include: ? Berries. ? Beans. ? Lentils. ? Green vegetables.  Exercise 3 or more times a week. Aim for 30 minutes each time. Exercise enough to sweat and make your heart beat faster.  Keep all follow-up visits as told. This is important. You may need to have an exam of the large intestine. This is called a colonoscopy. Contact a doctor if:  Your pain does not get better.  You have a hard time eating or  drinking.  You are not pooping like normal. Get help right away if:  Your pain gets worse.  Your problems do not get better.  Your problems get worse very fast.  You have a fever.  You throw up (vomit) more than one time.  You have poop that is: ? Bloody. ? Black. ? Tarry. Summary  Diverticulitis is when small pockets in your large intestine (colon) get infected or swollen.  Take medicines only as told by your doctor.  Follow a diet as told by your doctor. This information is not intended to replace advice given to you by your health care provider. Make sure you discuss any questions you have with your health care provider. Document Revised: 09/29/2017 Document Reviewed: 11/03/2016 Elsevier Patient Education  2020 [employer].
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
## 2020-03-22 NOTE — Progress Notes (Signed)
   Subjective/Chief Complaint: No complaints.    Objective: Vital signs in last 24 hours: Temp:  [98 F (36.7 C)-99 F (37.2 C)] 98 F (36.7 C) (05/23 0501) Pulse Rate:  [57-74] 57 (05/23 0501) Resp:  [17-18] 17 (05/23 0501) BP: (140-145)/(82-99) 141/87 (05/23 0501) SpO2:  [96 %-100 %] 96 % (05/23 0501) Last BM Date: 03/20/20  Intake/Output from previous day: No intake/output data recorded. Intake/Output this shift: No intake/output data recorded.  General appearance: alert and cooperative Resp: clear to auscultation bilaterally Cardio: regular rate and rhythm GI: soft, nontender  Lab Results:  Recent Labs    03/21/20 0812 03/22/20 0454  WBC 6.9 6.6  HGB 15.1 14.8  HCT 44.5 42.4  PLT 210 221   BMET Recent Labs    03/20/20 0327 03/21/20 0812  NA 137 138  K 3.6 3.8  CL 104 103  CO2 24 26  GLUCOSE 92 92  BUN 9 10  CREATININE 1.13 1.10  CALCIUM 9.0 9.4   PT/INR No results for input(s): LABPROT, INR in the last 72 hours. ABG No results for input(s): PHART, HCO3 in the last 72 hours.  Invalid input(s): PCO2, PO2  Studies/Results: No results found.  Anti-infectives: Anti-infectives (From admission, onward)   Start     Dose/Rate Route Frequency Ordered Stop   03/21/20 1030  amoxicillin-clavulanate (AUGMENTIN) 875-125 MG per tablet 1 tablet     1 tablet Oral Every 8 hours 03/21/20 1017     03/19/20 2200  piperacillin-tazobactam (ZOSYN) IVPB 3.375 g  Status:  Discontinued     3.375 g 12.5 mL/hr over 240 Minutes Intravenous Every 8 hours 03/19/20 1115 03/21/20 1017   03/19/20 1045  piperacillin-tazobactam (ZOSYN) IVPB 3.375 g     3.375 g 100 mL/hr over 30 Minutes Intravenous  Once 03/19/20 1038 03/19/20 1200      Assessment/Plan: s/p * No surgery found * Advance diet Discharge  Diverticulitis seems to be resolving  LOS: 2 days    Duane White 03/22/2020

## 2020-03-22 NOTE — Progress Notes (Signed)
Pt given discharge summary and discharged. 

## 2020-04-01 NOTE — Discharge Summary (Signed)
Name: Duane White MRN: [id number] DOB: 17-Jan-1966 54 y.o. [facility]  Date of Admission: 03/19/2020  5:37 AM Date of Discharge: 03/22/2020 Attending Physician: Reymundo Poll MD Discharge Diagnosis: 1. Complicated Diverticulitis   Discharge Medications: Allergies as of 03/22/2020   No Known Allergies     Medication List    TAKE these medications   amoxicillin-clavulanate 875-125 MG tablet Commonly known as: AUGMENTIN Take 1 tablet by mouth every 8 (eight) hours. Starting dose on 03/22/2020 at 6:30 pm. Notes to patient: 03/22/20   pantoprazole 20 MG tablet Commonly known as: PROTONIX Take 1 tablet (20 mg total) by mouth daily. Notes to patient: Continue home schedule   polyethylene glycol 17 g packet Commonly known as: MIRALAX / GLYCOLAX Take 17 g by mouth daily as needed for mild constipation.       Disposition and follow-up:   Mr.Duane White was discharged from [facility] in Stable condition.  At the hospital follow up visit please address:  1.  Complicated Diverticulitis   - Follow up in 6 weeks for Colonoscopy   2.  Labs / imaging needed at time of follow-up: None  3.  Pending labs/ test needing follow-up: None   Follow-up Appointments: Follow-up Information    [facility] Schedule an appointment as soon as possible for a visit in 1 week(s).   Contact information: [address] [place] [address] [phone]           Hospital Course by problem list: 1. Complicated Diverticulitis  Patient presented to the emergency department with two weeks of abdominal pain, sensation of being full, and loss of appetite. The day of presentation he had sharp lower abdominal pain and non bilious emesis. On evaluation in the emergency room, CT abdomen showed inflammatory changes involving the distal colon and small bowel. In the upper pelvis there was a low density collection which may represent an interloop abscess.  Patient was started on Zosyn and made NPO. He was continued on antibiotic therapy and his diet was advanced. He was discharged in stable condition.   Discharge Vitals:   BP (!) 156/90   Pulse 67   Temp 98.7 F (37.1 C)   Resp 17   Ht 5\' 9"  (1.753 m)   Wt 111.1 kg   SpO2 100%   BMI 36.18 kg/m   Pertinent Labs, Studies, and Procedures:  CBC Latest Ref Rng & Units 03/22/2020 03/21/2020 03/20/2020  WBC 4.0 - 10.5 K/uL 6.6 6.9 11.8(H)  Hemoglobin 13.0 - 17.0 g/dL 03/22/2020 08.6 76.1  Hematocrit 39.0 - 52.0 % 42.4 44.5 41.2  Platelets 150 - 400 K/uL 221 210 185   EXAM: CT ABDOMEN AND PELVIS WITH CONTRAST  TECHNIQUE: Multidetector CT imaging of the abdomen and pelvis was performed using the standard protocol following bolus administration of intravenous contrast.  CONTRAST:  95.0 OMNIPAQUE IOHEXOL 300 MG/ML  SOLN  COMPARISON:  Acute abdominal series 03/03/2020  FINDINGS: Lower chest: Lung bases are clear.  No pleural effusions.  Hepatobiliary: Normal appearance of the liver and gallbladder. No biliary dilatation. Main portal venous system is patent.  Pancreas: Unremarkable. No pancreatic ductal dilatation or surrounding inflammatory changes.  Spleen: Normal in size without focal abnormality.  Adrenals/Urinary Tract: Normal adrenal glands. 1.4 cm hypodensity in the right kidney upper pole is suggestive for a cortical cyst. No suspicious renal lesions. No hydronephrosis. Small amount of fluid in the urinary bladder. No evidence for kidney stones.  Stomach/Bowel: Normal appearance of the stomach and duodenum.  Wall thickening involving the rectosigmoid colon. There is a low-density collection between the rectosigmoid colon and a loop of small bowel in the upper pelvis. The low-density collection between the colon and small bowel measures 2.6 x 2.3 cm on sequence 3, image 62. Findings raise concern for colo-enteric fistula and/or abscess in this area. Difficult to  evaluate the loop of small bowel proximal to this area of inflammation. Mesenteric edema centered around the thickened bowel loops in the upper pelvis. No evidence for a bowel obstruction.  Vascular/Lymphatic: Atherosclerotic disease involving the aorta and iliac arteries without aortic aneurysm. Mildly prominent retroperitoneal lymph nodes. Precaval lymph node in the lower abdomen measures 0.8 cm in the short axis on sequence 3, image 40. Multiple small lymph nodes along the right side of the inferior vena cava.  Reproductive: Prostate is unremarkable.  Other: Negative for free air. Irregularity along the anterior abdominal midline subcutaneous tissues could be related to previous surgery.  Musculoskeletal: Severe facet arthropathy in the lumbar spine. Anterior bridging osteophytes in the lower thoracic spine. Ankylosis involving the SI joints, right side greater than left.  IMPRESSION: 1. Inflammatory changes involving the distal colon and small bowel in the upper pelvis. Wall thickening at the rectosigmoid junction and there is a low-density collection between this thickened loop of large bowel and a loop of small bowel. Low-density collection may represent an interloop abscess and cannot exclude a colo-enteric fistula at this location. Underlying etiology for this bowel inflammation is uncertain. Findings could be related to diverticulitis but indeterminate. 2. No evidence for bowel obstruction. 3. Multiple small lymph nodes in the retroperitoneum are nonspecific and could be reactive. 4.  Aortic Atherosclerosis (ICD10-I70.0).  Discharge Instructions: Discharge Instructions    Ambulatory referral to Gastroenterology   Complete by: As directed    What is the reason for referral?: Colonoscopy Comment - Follow up diverticulitis    Diet - low sodium heart healthy   Complete by: As directed    Increase activity slowly   Complete by: As directed        Signed: Maudie Mercury, MD 04/01/2020, 1:20 PM   Pager: [phone]

## 2022-04-12 ENCOUNTER — Encounter (HOSPITAL_BASED_OUTPATIENT_CLINIC_OR_DEPARTMENT_OTHER): Payer: Self-pay

## 2022-04-12 ENCOUNTER — Emergency Department (HOSPITAL_BASED_OUTPATIENT_CLINIC_OR_DEPARTMENT_OTHER)
Admission: EM | Admit: 2022-04-12 | Discharge: 2022-04-12 | Disposition: A | Discharge status: Home / Self Care | Payer: No Typology Code available for payment source | Attending: Emergency Medicine | Admitting: Emergency Medicine

## 2022-04-12 DIAGNOSIS — T148XXA Other injury of unspecified body region, initial encounter: Secondary | ICD-10-CM

## 2022-04-12 DIAGNOSIS — Z955 Presence of coronary angioplasty implant and graft: Secondary | ICD-10-CM | POA: Insufficient documentation

## 2022-04-12 DIAGNOSIS — I2510 Atherosclerotic heart disease of native coronary artery without angina pectoris: Secondary | ICD-10-CM | POA: Insufficient documentation

## 2022-04-12 DIAGNOSIS — T8149XA Infection following a procedure, other surgical site, initial encounter: Secondary | ICD-10-CM | POA: Insufficient documentation

## 2022-04-12 DIAGNOSIS — Z951 Presence of aortocoronary bypass graft: Secondary | ICD-10-CM

## 2022-04-12 DIAGNOSIS — I10 Essential (primary) hypertension: Secondary | ICD-10-CM | POA: Insufficient documentation

## 2022-04-12 HISTORY — DX: Essential (primary) hypertension: I10

## 2022-04-12 NOTE — ED Triage Notes (Signed)
Pt ambulatory to room. Accompanied by wife. Pt S/P Cabg. Discharged from hospital yesterday. Pt concerned due to small amount of drainage for distal portion of incision. Denies pain  No redness or swelling noted

## 2022-04-12 NOTE — ED Provider Notes (Signed)
  [facility] EMERGENCY DEPARTMENT Provider Note   CSN: [id number] Arrival date & time: 04/12/22  1022     History {Add pertinent medical, surgical, social history, OB history to HPI:1} No chief complaint on file.   Kenson Groh is a 56 y.o. male.  HPI     56 year old male with a history of hypertension, hyperlipidemia, coronary artery disease with recent coronary artery bypass graft x5 on June 5 at [facility] Dr. Alisia Ferrari    Pleural drains were removed 6 7, mediastinal drain removed 6 8   Home Medications Prior to Admission medications   Medication Sig Start Date End Date Taking? Authorizing Provider  amoxicillin-clavulanate (AUGMENTIN) 875-125 MG tablet Take 1 tablet by mouth every 8 (eight) hours. Starting dose on 03/22/2020 at 6:30 pm. 03/22/20   Dolan Amen, MD  pantoprazole (PROTONIX) 20 MG tablet Take 1 tablet (20 mg total) by mouth daily. 03/03/20   Placido Sou, PA-C  polyethylene glycol (MIRALAX / GLYCOLAX) 17 g packet Take 17 g by mouth daily as needed for mild constipation. 03/22/20   Dolan Amen, MD      Allergies    Patient has no known allergies.    Review of Systems   Review of Systems  Physical Exam Updated Vital Signs There were no vitals taken for this visit. Physical Exam  ED Results / Procedures / Treatments   Labs (all labs ordered are listed, but only abnormal results are displayed) Labs Reviewed - No data to display  EKG None  Radiology No results found.  Procedures Procedures  {Document cardiac monitor, telemetry assessment procedure when appropriate:1}  Medications Ordered in ED Medications - No data to display  ED Course/ Medical Decision Making/ A&P                           Medical Decision Making  ***  {Document critical care time when appropriate:1} {Document review of labs and clinical decision tools ie heart score, Chads2Vasc2 etc:1}  {Document your independent review of radiology  images, and any outside records:1} {Document your discussion with family members, caretakers, and with consultants:1} {Document social determinants of health affecting pt's care:1} {Document your decision making why or why not admission, treatments were needed:1} Final Clinical Impression(s) / ED Diagnoses Final diagnoses:  None    Rx / DC Orders ED Discharge Orders     None

## 2022-04-12 NOTE — ED Notes (Signed)
Pts wife and EDP spoke with [facility].Pt instructed to go to [facility] at this time for evaluation. Pt ambulatory upon discharge

## 2022-04-19 ENCOUNTER — Telehealth (HOSPITAL_COMMUNITY): Payer: Self-pay | Admitting: *Deleted

## 2022-04-19 NOTE — Telephone Encounter (Signed)
Received referral notification from Dr. Alisia Ferrari at [facility] for this pt to participate in Cardiac Rehab s/p CABG x 5. Pt heart surgery was on 6/5.  Pt with brief ER visit due to sternal drainage and he has completed 2 follow up appt with Dr. Alisia Ferrari. He has an upcoming appt on 7/6.  Called and spoke to pt regarding his understanding of the [place] authorization.  Spoke to [place] who indicated that he did have an authorization for the heart surgery but did not know it  the authorization also included Cardiac Rehab.  Searched in CareEverywhere and can not find this information.  Kieron has upcoming appt on 7/5 with his PCP - Dr. Maggie Font.  Advised Mathieu who is interested in participating, to please ask for an authorization specific for cardiac rehab.  Lanorris is unclear on who he will see for cardiology needs.  He did not have a cardiologist prior to having the heart surgery.  Alanson Aly, BSN Cardiac and Emergency planning/management officer

## 2022-04-20 ENCOUNTER — Emergency Department (HOSPITAL_BASED_OUTPATIENT_CLINIC_OR_DEPARTMENT_OTHER): Payer: No Typology Code available for payment source

## 2022-04-20 ENCOUNTER — Encounter (HOSPITAL_BASED_OUTPATIENT_CLINIC_OR_DEPARTMENT_OTHER): Payer: Self-pay

## 2022-04-20 ENCOUNTER — Other Ambulatory Visit: Payer: Self-pay

## 2022-04-20 ENCOUNTER — Emergency Department (HOSPITAL_BASED_OUTPATIENT_CLINIC_OR_DEPARTMENT_OTHER)
Admission: EM | Admit: 2022-04-20 | Discharge: 2022-04-20 | Disposition: A | Discharge status: Home / Self Care | Payer: No Typology Code available for payment source | Attending: Emergency Medicine | Admitting: Emergency Medicine

## 2022-04-20 DIAGNOSIS — S92421A Displaced fracture of distal phalanx of right great toe, initial encounter for closed fracture: Secondary | ICD-10-CM

## 2022-04-20 DIAGNOSIS — M79674 Pain in right toe(s): Secondary | ICD-10-CM | POA: Insufficient documentation

## 2022-04-20 DIAGNOSIS — Y92009 Unspecified place in unspecified non-institutional (private) residence as the place of occurrence of the external cause: Secondary | ICD-10-CM | POA: Diagnosis not present

## 2022-04-20 DIAGNOSIS — W228XXA Striking against or struck by other objects, initial encounter: Secondary | ICD-10-CM | POA: Insufficient documentation
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
## 2022-04-20 NOTE — Discharge Instructions (Addendum)
Take Tylenol and Motrin for pain.  Wear the postop boot to help immobilize the foot and specifically your toe.  Follow-up with podiatry in the next week for reevaluation, call today to schedule appointment for next week.  Return for new or worsening symptoms.
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
## 2022-11-02 IMAGING — DX DG TOE GREAT 2+V*R*
3 series · 3 of 3 positions shown · non-contrast
Comparison: None Available.

CLINICAL DATA: Toe pain. Jammed great toe into propane tank last
night. Pain localizes to the medial aspect of the tuft.

EXAM:
RIGHT GREAT TOE

[toe ap]
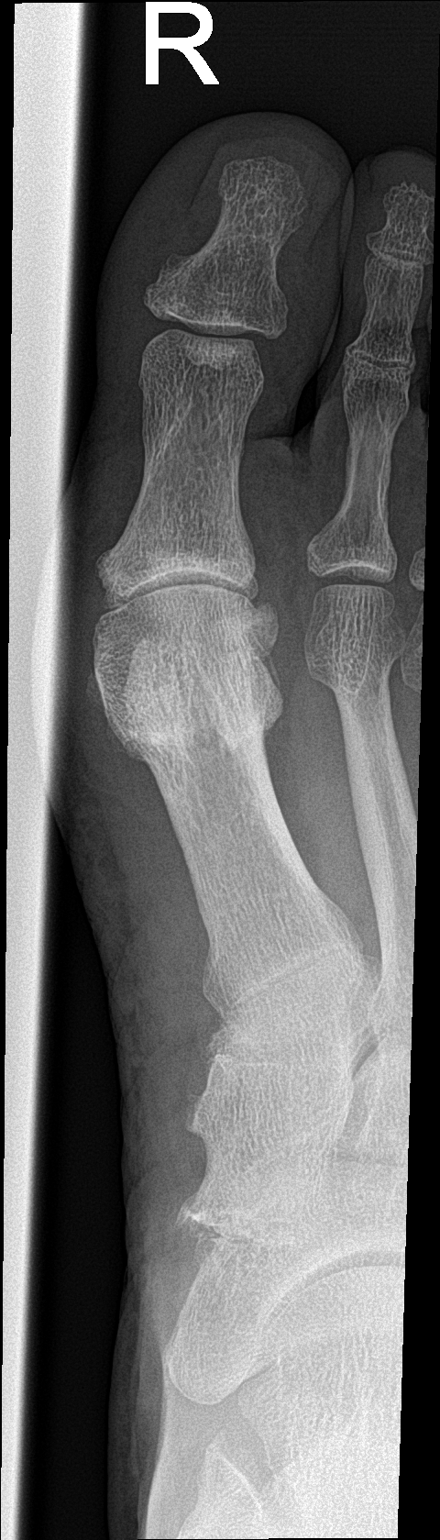

[toe obl]
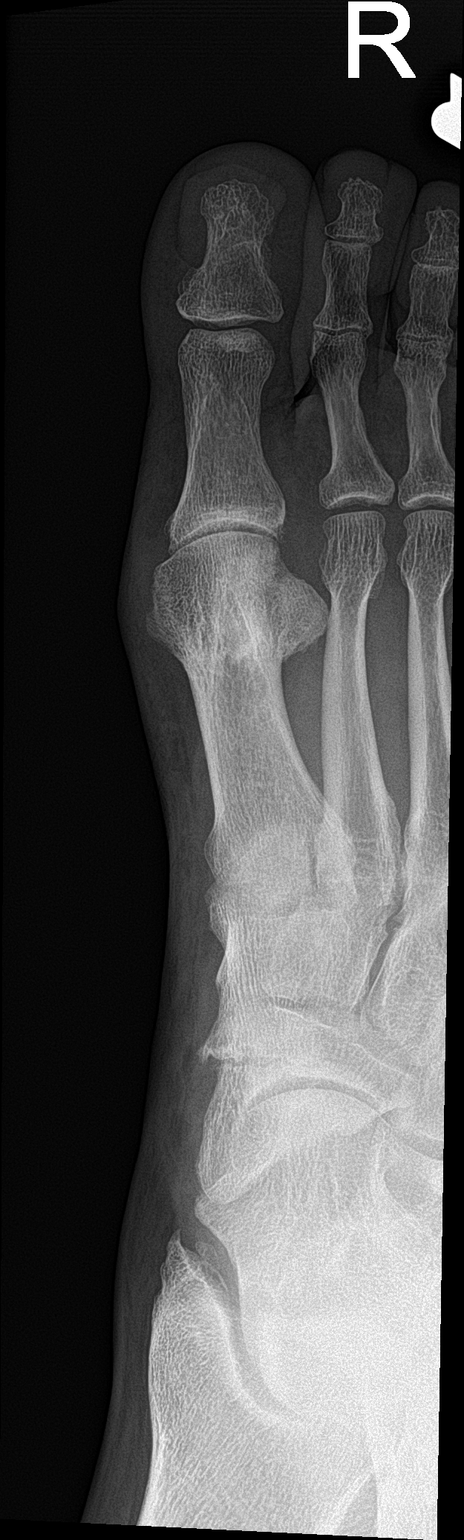

[toe lat]
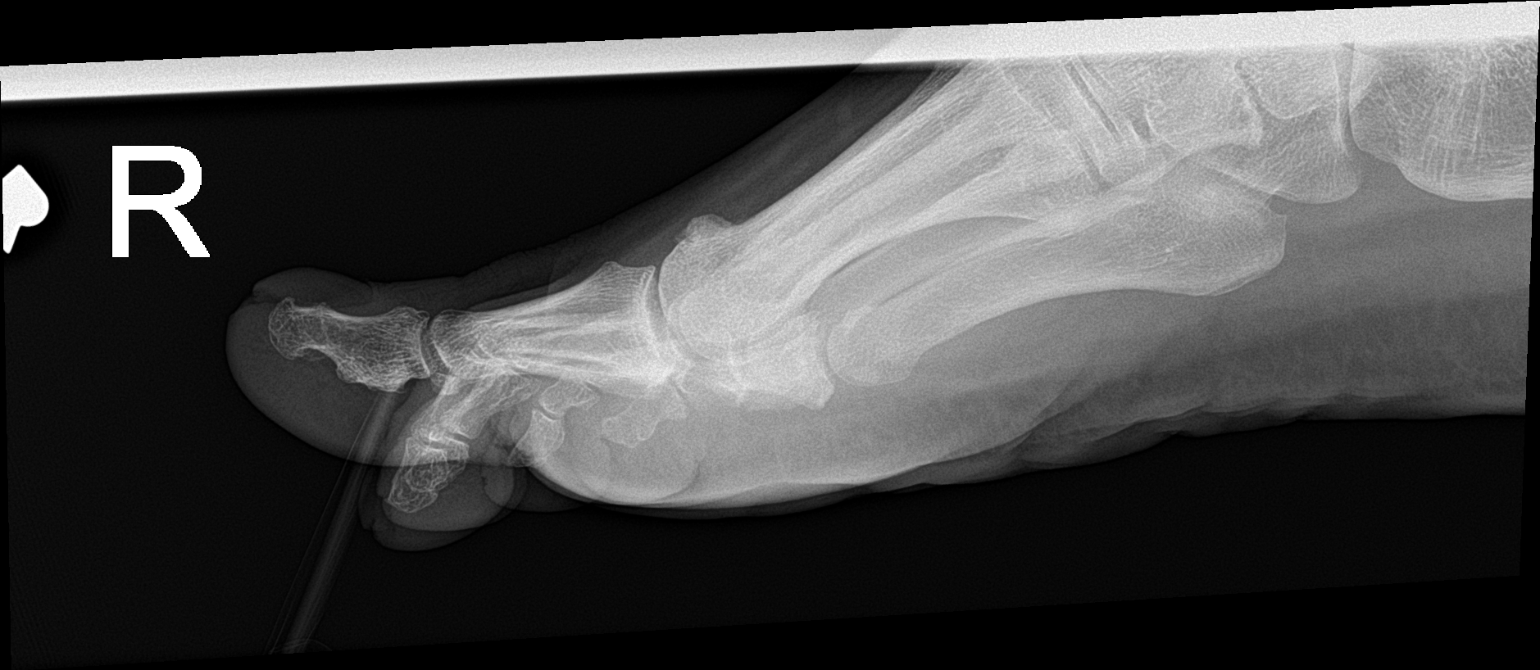

[3 of 3 positions shown; findings below may reference images not displayed]

FINDINGS: No acute displaced fracture deformity identified. On the lateral
projection radiograph there is a sclerotic line extending through
the distal shaft of the first distal phalanx which is of uncertain
clinical significance but may reflect remote injury. Mild
degenerative changes are identified at the first MTP joint.
IMPRESSION: 1. Mild first MTP joint osteoarthritis.
2. Suspect remote injury involving the distal shaft of the first
distal phalanx.

## 2023-05-18 ENCOUNTER — Emergency Department (HOSPITAL_COMMUNITY): Payer: No Typology Code available for payment source

## 2023-05-18 ENCOUNTER — Emergency Department (HOSPITAL_COMMUNITY)
Admission: EM | Admit: 2023-05-18 | Discharge: 2023-05-18 | Disposition: A | Discharge status: Home / Self Care | Payer: No Typology Code available for payment source | Attending: Emergency Medicine | Admitting: Emergency Medicine

## 2023-05-18 ENCOUNTER — Other Ambulatory Visit: Payer: Self-pay

## 2023-05-18 DIAGNOSIS — S41102A Unspecified open wound of left upper arm, initial encounter: Secondary | ICD-10-CM | POA: Diagnosis not present

## 2023-05-18 DIAGNOSIS — S21102A Unspecified open wound of left front wall of thorax without penetration into thoracic cavity, initial encounter: Secondary | ICD-10-CM | POA: Insufficient documentation

## 2023-05-18 DIAGNOSIS — Z23 Encounter for immunization: Secondary | ICD-10-CM | POA: Insufficient documentation

## 2023-05-18 DIAGNOSIS — S61402A Unspecified open wound of left hand, initial encounter: Secondary | ICD-10-CM | POA: Diagnosis present

## 2023-05-18 DIAGNOSIS — S62301B Unspecified fracture of second metacarpal bone, left hand, initial encounter for open fracture: Secondary | ICD-10-CM | POA: Insufficient documentation

## 2023-05-18 DIAGNOSIS — Z7982 Long term (current) use of aspirin: Secondary | ICD-10-CM | POA: Insufficient documentation

## 2023-05-18 DIAGNOSIS — W3400XA Accidental discharge from unspecified firearms or gun, initial encounter: Secondary | ICD-10-CM | POA: Insufficient documentation

## 2023-05-18 DIAGNOSIS — R791 Abnormal coagulation profile: Secondary | ICD-10-CM | POA: Diagnosis not present

## 2023-05-18 LAB — I-STAT CHEM 8, ED
BUN: 19 mg/dL (ref 6–20)
Calcium, Ion: 1.1 mmol/L — ABNORMAL LOW (ref 1.15–1.40)
Chloride: 108 mmol/L (ref 98–111)
Creatinine, Ser: 2 mg/dL — ABNORMAL HIGH (ref 0.61–1.24)
Glucose, Bld: 130 mg/dL — ABNORMAL HIGH (ref 70–99)
HCT: 37 % — ABNORMAL LOW (ref 39.0–52.0)
Hemoglobin: 12.6 g/dL — ABNORMAL LOW (ref 13.0–17.0)
Potassium: 3.7 mmol/L (ref 3.5–5.1)
Sodium: 139 mmol/L (ref 135–145)
TCO2: 18 mmol/L — ABNORMAL LOW (ref 22–32)

## 2023-05-18 LAB — COMPREHENSIVE METABOLIC PANEL
ALT: 17 U/L (ref 0–44)
AST: 25 U/L (ref 15–41)
Albumin: 3.7 g/dL (ref 3.5–5.0)
Alkaline Phosphatase: 59 U/L (ref 38–126)
Anion gap: 12 (ref 5–15)
BUN: 16 mg/dL (ref 6–20)
CO2: 15 mmol/L — ABNORMAL LOW (ref 22–32)
Calcium: 8.5 mg/dL — ABNORMAL LOW (ref 8.9–10.3)
Chloride: 107 mmol/L (ref 98–111)
Creatinine, Ser: 1.75 mg/dL — ABNORMAL HIGH (ref 0.61–1.24)
GFR, Estimated: 45 mL/min — ABNORMAL LOW (ref >60)
Glucose, Bld: 127 mg/dL — ABNORMAL HIGH (ref 70–99)
Potassium: 3.8 mmol/L (ref 3.5–5.1)
Sodium: 134 mmol/L — ABNORMAL LOW (ref 135–145)
Total Bilirubin: 0.8 mg/dL (ref 0.3–1.2)
Total Protein: 6.6 g/dL (ref 6.5–8.1)

## 2023-05-18 LAB — CBC
HCT: 40.8 % (ref 39.0–52.0)
Hemoglobin: 12.8 g/dL — ABNORMAL LOW (ref 13.0–17.0)
MCH: 33.6 pg (ref 26.0–34.0)
MCHC: 31.4 g/dL (ref 30.0–36.0)
MCV: 107.1 fL — ABNORMAL HIGH (ref 80.0–100.0)
Platelets: 182 10*3/uL (ref 150–400)
RBC: 3.81 MIL/uL — ABNORMAL LOW (ref 4.22–5.81)
RDW: 13.2 % (ref 11.5–15.5)
WBC: 10.6 10*3/uL — ABNORMAL HIGH (ref 4.0–10.5)
nRBC: 0 % (ref 0.0–0.2)

## 2023-05-18 LAB — URINALYSIS, ROUTINE W REFLEX MICROSCOPIC
Bilirubin Urine: NEGATIVE
Glucose, UA: NEGATIVE mg/dL
Hgb urine dipstick: NEGATIVE
Ketones, ur: NEGATIVE mg/dL
Leukocytes,Ua: NEGATIVE
Nitrite: NEGATIVE
Protein, ur: NEGATIVE mg/dL
Specific Gravity, Urine: 1.019 (ref 1.005–1.030)
pH: 5 (ref 5.0–8.0)

## 2023-05-18 LAB — PROTIME-INR
INR: 1 (ref 0.8–1.2)
Prothrombin Time: 13.3 seconds (ref 11.4–15.2)

## 2023-05-18 LAB — SAMPLE TO BLOOD BANK

## 2023-05-18 LAB — ETHANOL: Alcohol, Ethyl (B): 142 mg/dL — ABNORMAL HIGH (ref <10)

## 2023-05-18 LAB — I-STAT CG4 LACTIC ACID, ED: Lactic Acid, Venous: 2.4 mmol/L (ref 0.5–1.9)

## 2023-05-18 MED ORDER — OXYCODONE HCL 5 MG PO TABS: 5.0000 mg | ORAL_TABLET | Freq: Four times a day (QID) | ORAL | 0 refills | Status: AC | PRN

## 2023-05-18 MED ORDER — FENTANYL CITRATE PF 50 MCG/ML IJ SOSY
PREFILLED_SYRINGE | INTRAMUSCULAR | Status: AC
  Administered 2023-05-18: 50 ug via INTRAVENOUS
  Filled 2023-05-18: qty 1

## 2023-05-18 MED ORDER — FENTANYL CITRATE PF 50 MCG/ML IJ SOSY
50.0000 ug | PREFILLED_SYRINGE | Freq: Once | INTRAMUSCULAR | Status: AC
  Administered 2023-05-18: 50 ug via INTRAVENOUS
  Filled 2023-05-18: qty 1

## 2023-05-18 MED ORDER — IOHEXOL 350 MG/ML SOLN
100.0000 mL | Freq: Once | INTRAVENOUS | Status: AC | PRN
  Administered 2023-05-18: 100 mL via INTRAVENOUS

## 2023-05-18 MED ORDER — TETANUS-DIPHTH-ACELL PERTUSSIS 5-2.5-18.5 LF-MCG/0.5 IM SUSY
0.5000 mL | PREFILLED_SYRINGE | Freq: Once | INTRAMUSCULAR | Status: AC
  Administered 2023-05-18: 0.5 mL via INTRAMUSCULAR

## 2023-05-18 MED ORDER — FENTANYL CITRATE PF 50 MCG/ML IJ SOSY: 50.0000 ug | PREFILLED_SYRINGE | Freq: Once | INTRAMUSCULAR | Status: AC

## 2023-05-18 MED ORDER — CEPHALEXIN 500 MG PO CAPS: 500.0000 mg | ORAL_CAPSULE | Freq: Four times a day (QID) | ORAL | 0 refills | Status: AC

## 2023-05-18 MED ORDER — CEFAZOLIN SODIUM-DEXTROSE 1-4 GM/50ML-% IV SOLN
1.0000 g | Freq: Once | INTRAVENOUS | Status: AC
  Administered 2023-05-18: 1 g via INTRAVENOUS
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
## 2023-05-18 NOTE — Progress Notes (Signed)
Orthopedic Tech Progress Note Patient Details:  Sylar Voong III 1966/09/15 [id number]  Well-padded radial gutter splint applied to LUE. Wounds on L hand were dressed by Sheria Lang, RN. Sling for LUE is at bedside to apply once pt is dressed for d/c.  Ortho Devices Type of Ortho Device: Rad Gutter splint, Arm sling Ortho Device/Splint Location: LUE Ortho Device/Splint Interventions: Ordered, Application, Adjustment   Post Interventions Patient Tolerated: Well Instructions Provided: Care of device, Adjustment of device  Addaleigh Nicholls Carmine Savoy 05/18/2023, 7:43 PM

## 2024-08-15 ENCOUNTER — Other Ambulatory Visit (HOSPITAL_COMMUNITY): Payer: Self-pay | Admitting: Urology

## 2024-08-15 DIAGNOSIS — R972 Elevated prostate specific antigen [PSA]: Secondary | ICD-10-CM

## 2024-08-29 ENCOUNTER — Ambulatory Visit (HOSPITAL_COMMUNITY)
Admission: RE | Admit: 2024-08-29 | Discharge: 2024-08-29 | Disposition: A | Discharge status: Home / Self Care | Source: Ambulatory Visit | Attending: Urology | Admitting: Urology

## 2024-08-29 DIAGNOSIS — R972 Elevated prostate specific antigen [PSA]: Secondary | ICD-10-CM | POA: Diagnosis present

## 2024-08-29 MED ORDER — GADOBUTROL 1 MMOL/ML IV SOLN
8.0000 mL | Freq: Once | INTRAVENOUS | Status: AC | PRN
  Administered 2024-08-29: 8 mL via INTRAVENOUS
